# Patient Record
Sex: Female | Born: 1955 | Race: Black or African American | Hispanic: No | Marital: Married | Smoking: Never smoker
Health system: Southern US, Community
[De-identification: ages and names within clinical notes are randomized; demographics above are authoritative.]

## PROBLEM LIST (undated history)

## (undated) DIAGNOSIS — E119 Type 2 diabetes mellitus without complications: Secondary | ICD-10-CM

## (undated) DIAGNOSIS — E785 Hyperlipidemia, unspecified: Secondary | ICD-10-CM

## (undated) DIAGNOSIS — I10 Essential (primary) hypertension: Secondary | ICD-10-CM

## (undated) HISTORY — PX: APPENDECTOMY: SHX54

## (undated) HISTORY — PX: ABDOMINAL HYSTERECTOMY: SHX81

---

## 2008-09-01 ENCOUNTER — Emergency Department (HOSPITAL_BASED_OUTPATIENT_CLINIC_OR_DEPARTMENT_OTHER): Admission: EM | Admit: 2008-09-01 | Discharge: 2008-09-01 | Payer: Self-pay | Admitting: Emergency Medicine

## 2008-09-01 ENCOUNTER — Ambulatory Visit: Payer: Self-pay | Admitting: Diagnostic Radiology

## 2010-05-12 LAB — DIFFERENTIAL
Lymphocytes Relative: 35 % (ref 12–46)
Lymphs Abs: 3.3 10*3/uL (ref 0.7–4.0)
Monocytes Relative: 4 % (ref 3–12)
Neutro Abs: 5.6 10*3/uL (ref 1.7–7.7)
Neutrophils Relative %: 59 % (ref 43–77)

## 2010-05-12 LAB — BASIC METABOLIC PANEL
Calcium: 9.8 mg/dL (ref 8.4–10.5)
Chloride: 97 mEq/L (ref 96–112)
Creatinine, Ser: 0.8 mg/dL (ref 0.4–1.2)
GFR calc Af Amer: >60 mL/min (ref >60)
GFR calc non Af Amer: >60 mL/min (ref >60)

## 2010-05-12 LAB — POCT CARDIAC MARKERS
CKMB, poc: <1 ng/mL — ABNORMAL LOW (ref 1.0–8.0)
Myoglobin, poc: 51.9 ng/mL (ref 12–200)
Myoglobin, poc: 71.5 ng/mL (ref 12–200)

## 2010-05-12 LAB — CBC
RBC: 4.35 MIL/uL (ref 3.87–5.11)
WBC: 9.6 10*3/uL (ref 4.0–10.5)

## 2010-06-08 ENCOUNTER — Emergency Department (HOSPITAL_BASED_OUTPATIENT_CLINIC_OR_DEPARTMENT_OTHER)
Admission: EM | Admit: 2010-06-08 | Discharge: 2010-06-08 | Disposition: A | Discharge status: Home / Self Care | Payer: PRIVATE HEALTH INSURANCE | Attending: Emergency Medicine | Admitting: Emergency Medicine

## 2010-06-08 DIAGNOSIS — Z79899 Other long term (current) drug therapy: Secondary | ICD-10-CM | POA: Insufficient documentation

## 2010-06-08 DIAGNOSIS — E7800 Pure hypercholesterolemia, unspecified: Secondary | ICD-10-CM | POA: Insufficient documentation

## 2010-06-08 DIAGNOSIS — E1169 Type 2 diabetes mellitus with other specified complication: Secondary | ICD-10-CM | POA: Insufficient documentation

## 2010-06-08 DIAGNOSIS — I10 Essential (primary) hypertension: Secondary | ICD-10-CM | POA: Insufficient documentation

## 2010-06-08 LAB — DIFFERENTIAL
Lymphocytes Relative: 35 % (ref 12–46)
Lymphs Abs: 2.7 10*3/uL (ref 0.7–4.0)
Monocytes Absolute: 0.4 10*3/uL (ref 0.1–1.0)
Monocytes Relative: 5 % (ref 3–12)
Neutro Abs: 4.5 10*3/uL (ref 1.7–7.7)
Neutrophils Relative %: 59 % (ref 43–77)

## 2010-06-08 LAB — URINALYSIS, ROUTINE W REFLEX MICROSCOPIC
Bilirubin Urine: NEGATIVE
Glucose, UA: >1000 mg/dL — AB
Hgb urine dipstick: NEGATIVE
Ketones, ur: NEGATIVE mg/dL
Leukocytes, UA: NEGATIVE
Protein, ur: NEGATIVE mg/dL
pH: 6 (ref 5.0–8.0)

## 2010-06-08 LAB — CBC
HCT: 39.7 % (ref 36.0–46.0)
Hemoglobin: 13.6 g/dL (ref 12.0–15.0)
MCH: 29.6 pg (ref 26.0–34.0)
MCHC: 34.3 g/dL (ref 30.0–36.0)
MCV: 86.3 fL (ref 78.0–100.0)
RBC: 4.6 MIL/uL (ref 3.87–5.11)

## 2010-06-08 LAB — COMPREHENSIVE METABOLIC PANEL
ALT: 19 U/L (ref 0–35)
AST: 15 U/L (ref 0–37)
Albumin: 4 g/dL (ref 3.5–5.2)
Calcium: 10.7 mg/dL — ABNORMAL HIGH (ref 8.4–10.5)
Chloride: 90 mEq/L — ABNORMAL LOW (ref 96–112)
Creatinine, Ser: 0.8 mg/dL (ref 0.4–1.2)
GFR calc Af Amer: >60 mL/min (ref >60)
Sodium: 129 mEq/L — ABNORMAL LOW (ref 135–145)
Total Bilirubin: 0.3 mg/dL (ref 0.3–1.2)

## 2010-06-08 LAB — GLUCOSE, CAPILLARY
Glucose-Capillary: >600 mg/dL (ref 70–99)
Glucose-Capillary: 270 mg/dL — ABNORMAL HIGH (ref 70–99)

## 2010-06-08 LAB — URINE MICROSCOPIC-ADD ON

## 2010-06-09 LAB — GLUCOSE, CAPILLARY: Glucose-Capillary: 227 mg/dL — ABNORMAL HIGH (ref 70–99)

## 2016-03-26 ENCOUNTER — Emergency Department (HOSPITAL_BASED_OUTPATIENT_CLINIC_OR_DEPARTMENT_OTHER)
Admission: EM | Admit: 2016-03-26 | Discharge: 2016-03-26 | Disposition: A | Discharge status: Home / Self Care | Payer: Non-veteran care | Attending: Emergency Medicine | Admitting: Emergency Medicine

## 2016-03-26 ENCOUNTER — Encounter (HOSPITAL_BASED_OUTPATIENT_CLINIC_OR_DEPARTMENT_OTHER): Payer: Self-pay | Admitting: *Deleted

## 2016-03-26 ENCOUNTER — Emergency Department (HOSPITAL_BASED_OUTPATIENT_CLINIC_OR_DEPARTMENT_OTHER): Payer: Non-veteran care

## 2016-03-26 DIAGNOSIS — Z7984 Long term (current) use of oral hypoglycemic drugs: Secondary | ICD-10-CM | POA: Diagnosis not present

## 2016-03-26 DIAGNOSIS — I10 Essential (primary) hypertension: Secondary | ICD-10-CM | POA: Insufficient documentation

## 2016-03-26 DIAGNOSIS — Z79899 Other long term (current) drug therapy: Secondary | ICD-10-CM | POA: Diagnosis not present

## 2016-03-26 DIAGNOSIS — E119 Type 2 diabetes mellitus without complications: Secondary | ICD-10-CM | POA: Insufficient documentation

## 2016-03-26 DIAGNOSIS — R079 Chest pain, unspecified: Secondary | ICD-10-CM | POA: Diagnosis present

## 2016-03-26 HISTORY — DX: Type 2 diabetes mellitus without complications: E11.9

## 2016-03-26 HISTORY — DX: Hyperlipidemia, unspecified: E78.5

## 2016-03-26 HISTORY — DX: Essential (primary) hypertension: I10

## 2016-03-26 LAB — COMPREHENSIVE METABOLIC PANEL
ALT: 20 U/L (ref 14–54)
ANION GAP: 9 (ref 5–15)
AST: 21 U/L (ref 15–41)
Albumin: 3.7 g/dL (ref 3.5–5.0)
Alkaline Phosphatase: 61 U/L (ref 38–126)
BUN: 13 mg/dL (ref 6–20)
CHLORIDE: 101 mmol/L (ref 101–111)
CO2: 27 mmol/L (ref 22–32)
Calcium: 9.5 mg/dL (ref 8.9–10.3)
Creatinine, Ser: 0.85 mg/dL (ref 0.44–1.00)
GFR calc non Af Amer: >60 mL/min (ref >60)
Glucose, Bld: 115 mg/dL — ABNORMAL HIGH (ref 65–99)
POTASSIUM: 4 mmol/L (ref 3.5–5.1)
Sodium: 137 mmol/L (ref 135–145)
Total Bilirubin: 0.6 mg/dL (ref 0.3–1.2)
Total Protein: 7.5 g/dL (ref 6.5–8.1)

## 2016-03-26 LAB — TROPONIN I

## 2016-03-26 LAB — CBC
HCT: 39.7 % (ref 36.0–46.0)
Hemoglobin: 13.2 g/dL (ref 12.0–15.0)
MCH: 30.1 pg (ref 26.0–34.0)
MCHC: 33.2 g/dL (ref 30.0–36.0)
MCV: 90.6 fL (ref 78.0–100.0)
PLATELETS: 263 10*3/uL (ref 150–400)
RBC: 4.38 MIL/uL (ref 3.87–5.11)
RDW: 14 % (ref 11.5–15.5)
WBC: 9.6 10*3/uL (ref 4.0–10.5)

## 2016-03-26 MED ORDER — ASPIRIN 81 MG PO CHEW
324.0000 mg | CHEWABLE_TABLET | Freq: Once | ORAL | Status: AC
  Administered 2016-03-26: 324 mg via ORAL
  Filled 2016-03-26: qty 4

## 2016-03-26 MED ORDER — ONDANSETRON 4 MG PO TBDP: 4.0000 mg | ORAL_TABLET | Freq: Three times a day (TID) | ORAL | 0 refills | Status: AC | PRN

## 2016-03-26 NOTE — ED Provider Notes (Signed)
[facility] Provider Note   CSN: [id number] Arrival date & time: 03/26/16  1318     History   Chief Complaint Chief Complaint  Patient presents with  . Chest Pain    HPI Mikayla Ruiz is a 61 y.o. female with PMHx of DM controlled not on insulin, HTN, hyperlipidemia, GERD presents with left sided chest pain x 3 days. She describes her chest pain as  worsening, constant, throughout the day, dull, 7/10 pain. She reports radiation from underneath left breast, up to center of chest and then towards left shoulder. She reports associated radiation to her left arm and today she states that she felt some radiation to her left jaw. She reports associated sweats, chills, nausea, fatigue, several bouts of diarrhea that last 2 days. She denies any SOB to me, cough, fevers, recent travel, immobility, history of Ca, estrogen use, cold like symptoms, flu, changes in urinary symptoms or bowel movements. She reports trying aspirin yesterday morning, last night, and this morning with moderate relief. Patient also tried advil last night with no relief. Patient denies trying anything else. Patient denies anything like this in the past. She states that her pain was worse today after doing laundry. She denies any other association of chest pain to exertion, activity, deep inspiration, or intake of food. She states she takes all her medications as prescribed.  Per EMR patient had a left heart catheterization in 07/27/2014 showing as a normal result.   The history is provided by the patient. No language interpreter was used.  Chest Pain   Pertinent negatives include no abdominal pain, no cough, no fever, no nausea, no numbness, no shortness of breath and no vomiting.    Past Medical History:  Diagnosis Date  . Diabetes mellitus without complication (HCC)   . Hyperlipemia   . Hypertension     There are no active problems to display for this patient.   Past Surgical History:  Procedure Laterality  Date  . ABDOMINAL HYSTERECTOMY    . APPENDECTOMY      OB History    No data available       Home Medications    Prior to Admission medications   Medication Sig Start Date End Date Taking? Authorizing Provider  Dulaglutide (TRULICITY) 1.5 MG/0.5ML SOPN Inject into the skin.   Yes Historical Provider, MD  empagliflozin (JARDIANCE) 10 MG TABS tablet Take 10 mg by mouth daily.   Yes Historical Provider, MD  losartan (COZAAR) 50 MG tablet Take 50 mg by mouth daily.   Yes Historical Provider, MD  metFORMIN (GLUCOPHAGE) 1000 MG tablet Take 1,000 mg by mouth 2 (two) times daily with a meal.   Yes Historical Provider, MD  PARoxetine (PAXIL) 30 MG tablet Take 30 mg by mouth daily.   Yes Historical Provider, MD  simvastatin (ZOCOR) 10 MG tablet Take 10 mg by mouth daily.   Yes Historical Provider, MD  ondansetron (ZOFRAN ODT) 4 MG disintegrating tablet Take 1 tablet (4 mg total) by mouth every 8 (eight) hours as needed for nausea or vomiting. 03/26/16   Alvina Chou, [place]    Family History History reviewed. No pertinent family history.  Social History Social History  Substance Use Topics  . Smoking status: Never Smoker  . Smokeless tobacco: Not on file  . Alcohol use No     Allergies   Patient has no known allergies.   Review of Systems Review of Systems  Constitutional: Positive for chills. Negative for fever.  Respiratory:  Negative for cough and shortness of breath.   Cardiovascular: Positive for chest pain. Negative for leg swelling.  Gastrointestinal: Negative for abdominal pain, diarrhea, nausea and vomiting.  Genitourinary: Negative for difficulty urinating and dysuria.  Skin: Negative for rash and wound.  Neurological: Negative for numbness.  All other systems reviewed and are negative.    Physical Exam Updated Vital Signs BP 126/73   Pulse 82   Temp 98.6 F (37 C) (Oral)   Resp 14   Ht 5' (1.524 m)   Wt 77.1 kg   SpO2 97%   BMI 33.20 kg/m    Physical Exam  Constitutional: She is oriented to person, place, and time. She appears well-developed and well-nourished.  Well appearing  HENT:  Head: Normocephalic and atraumatic.  Nose: Nose normal.  Mouth/Throat: Oropharynx is clear and moist.  Eyes: Conjunctivae and EOM are normal.  Neck: Normal range of motion. Neck supple. No JVD present. No tracheal deviation present.  Cardiovascular: Normal rate, normal heart sounds and intact distal pulses.   Pulmonary/Chest: Effort normal and breath sounds normal. No stridor. No respiratory distress. She has no wheezes. She has no rales.  Normal work of breathing. No respiratory distress noted.   Abdominal: Soft. There is no tenderness. There is no rebound and no guarding.  Musculoskeletal: Normal range of motion.  Neurological: She is alert and oriented to person, place, and time.  Skin: Skin is warm.  No leg swelling or edema  Psychiatric: She has a normal mood and affect. Her behavior is normal.  Nursing note and vitals reviewed.    ED Treatments / Results  Labs (all labs ordered are listed, but only abnormal results are displayed) Labs Reviewed  COMPREHENSIVE METABOLIC PANEL - Abnormal; Notable for the following:       Result Value   Glucose, Bld 115 (*)    All other components within normal limits  CBC  TROPONIN I  TROPONIN I    EKG  EKG Interpretation  Date/Time:  Wednesday March 26 2016 13:30:48 EST Ventricular Rate:  92 PR Interval:    QRS Duration: 78 QT Interval:  353 QTC Calculation: 437 R Axis:   74 Text Interpretation:  Sinus rhythm No previous ECGs available Confirmed by LITTLE MD, RACHEL [phone] on 03/26/2016 5:58:11 PM       Radiology Dg Chest 2 View  Result Date: 03/26/2016 CLINICAL DATA:  Chest pain and nausea for 2 days EXAM: CHEST  2 VIEW COMPARISON:  07/24/2014 FINDINGS: Normal mediastinum and cardiac silhouette. Normal pulmonary vasculature. No evidence of effusion, infiltrate, or  pneumothorax. No acute bony abnormality. IMPRESSION: No acute cardiopulmonary process. Electronically Signed   By: Genevive BiStewart  Edmunds M.D.   On: 03/26/2016 14:28    Procedures Procedures (including critical care time)  Medications Ordered in ED Medications  aspirin chewable tablet 324 mg (324 mg Oral Given 03/26/16 1540)     Initial Impression / Assessment and Plan / ED Course  I have reviewed the triage vital signs and the nursing notes.  Pertinent labs & imaging results that were available during my care of the patient were reviewed by me and considered in my medical decision making (see chart for details).    Patient has a left-sided chest pain 3 days. Risk Factors include HTN, HLD, DM, Given aspirin in ED. Chest pain is not likely of cardiac or pulmonary etiology d/t presentation. She is hemodynamically stable, no tracheal deviation, no JVD or new murmur, RRR, breath sounds equal bilaterally, EKG without acute  abnormalities, negative troponin x2, and negative CXR. Heart Score 3-4.  Due to patient's recent normal left heart catheterization, recent history of diarrhea, especially sometimes after she eats, I feel it is less likely it is cardiac related and feel safe to discharge patient.  Pt has been advised to return to the ED if CP becomes exertional, associated with diaphoresis or nausea, radiates to left jaw/arm, worsens or becomes concerning in any way. Pt appears reliable for follow up and is agreeable to discharge. Patient is in no acute distress. Hemodynamic stable. Patient able to tolerate PO. Patient also seen and evaluated by Dr. Clarene Duke who agrees with assessment and plan.  Final Clinical Impressions(s) / ED Diagnoses   Final diagnoses:  Nonspecific chest pain    New Prescriptions New Prescriptions   ONDANSETRON (ZOFRAN ODT) 4 MG DISINTEGRATING TABLET    Take 1 tablet (4 mg total) by mouth every 8 (eight) hours as needed for nausea or vomiting.     [address] [place],  [place] 03/26/16 1838    Laurence Spates, MD 03/29/16 [phone]

## 2016-03-26 NOTE — Discharge Instructions (Signed)
Please follow up with her primary care provider in 2-5 days regarding today's visit. Please take Zofran as needed for nausea or vomiting.  Get help right away if: Your chest pain is worse. You have a cough that gets worse, or you cough up blood. You have severe pain in your abdomen. You have severe weakness. You faint. You have sudden, unexplained chest discomfort. You have sudden, unexplained discomfort in your arms, back, neck, or jaw. You have shortness of breath at any time. You suddenly start to sweat, or your skin gets clammy. You feel nauseous or you vomit. You suddenly feel light-headed or dizzy. Your heart begins to beat quickly, or it feels like it is skipping beats.

## 2016-03-26 NOTE — ED Triage Notes (Signed)
Pt c/o left sided chest pain which radiates up left jaw  x 2 days with SOB x 2 days

## 2017-04-07 IMAGING — CR DG CHEST 2V
2 series · 2 of 2 positions shown · non-contrast
Comparison: 07/24/2014

CLINICAL DATA: Chest pain and nausea for 2 days

EXAM:
CHEST  2 VIEW

[w chest pa]
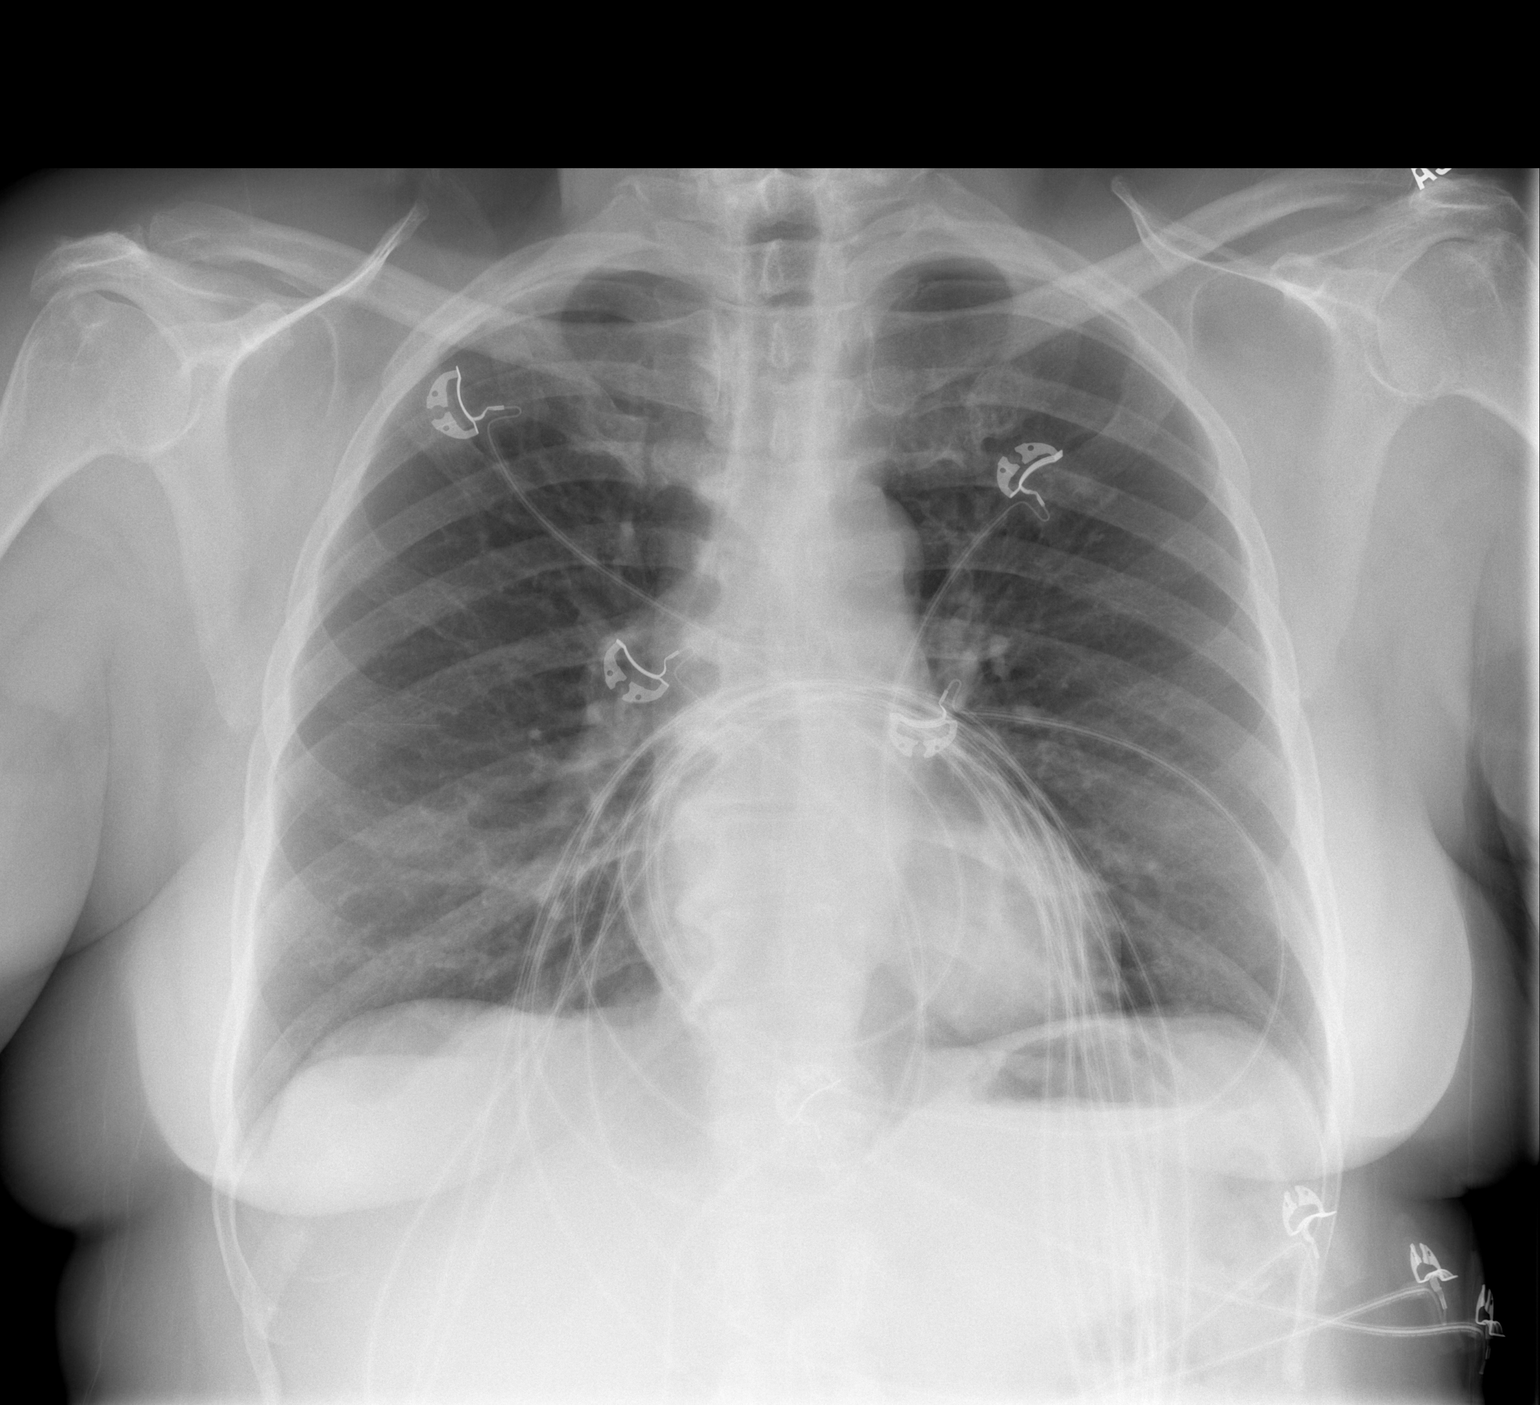

[w chest lat]
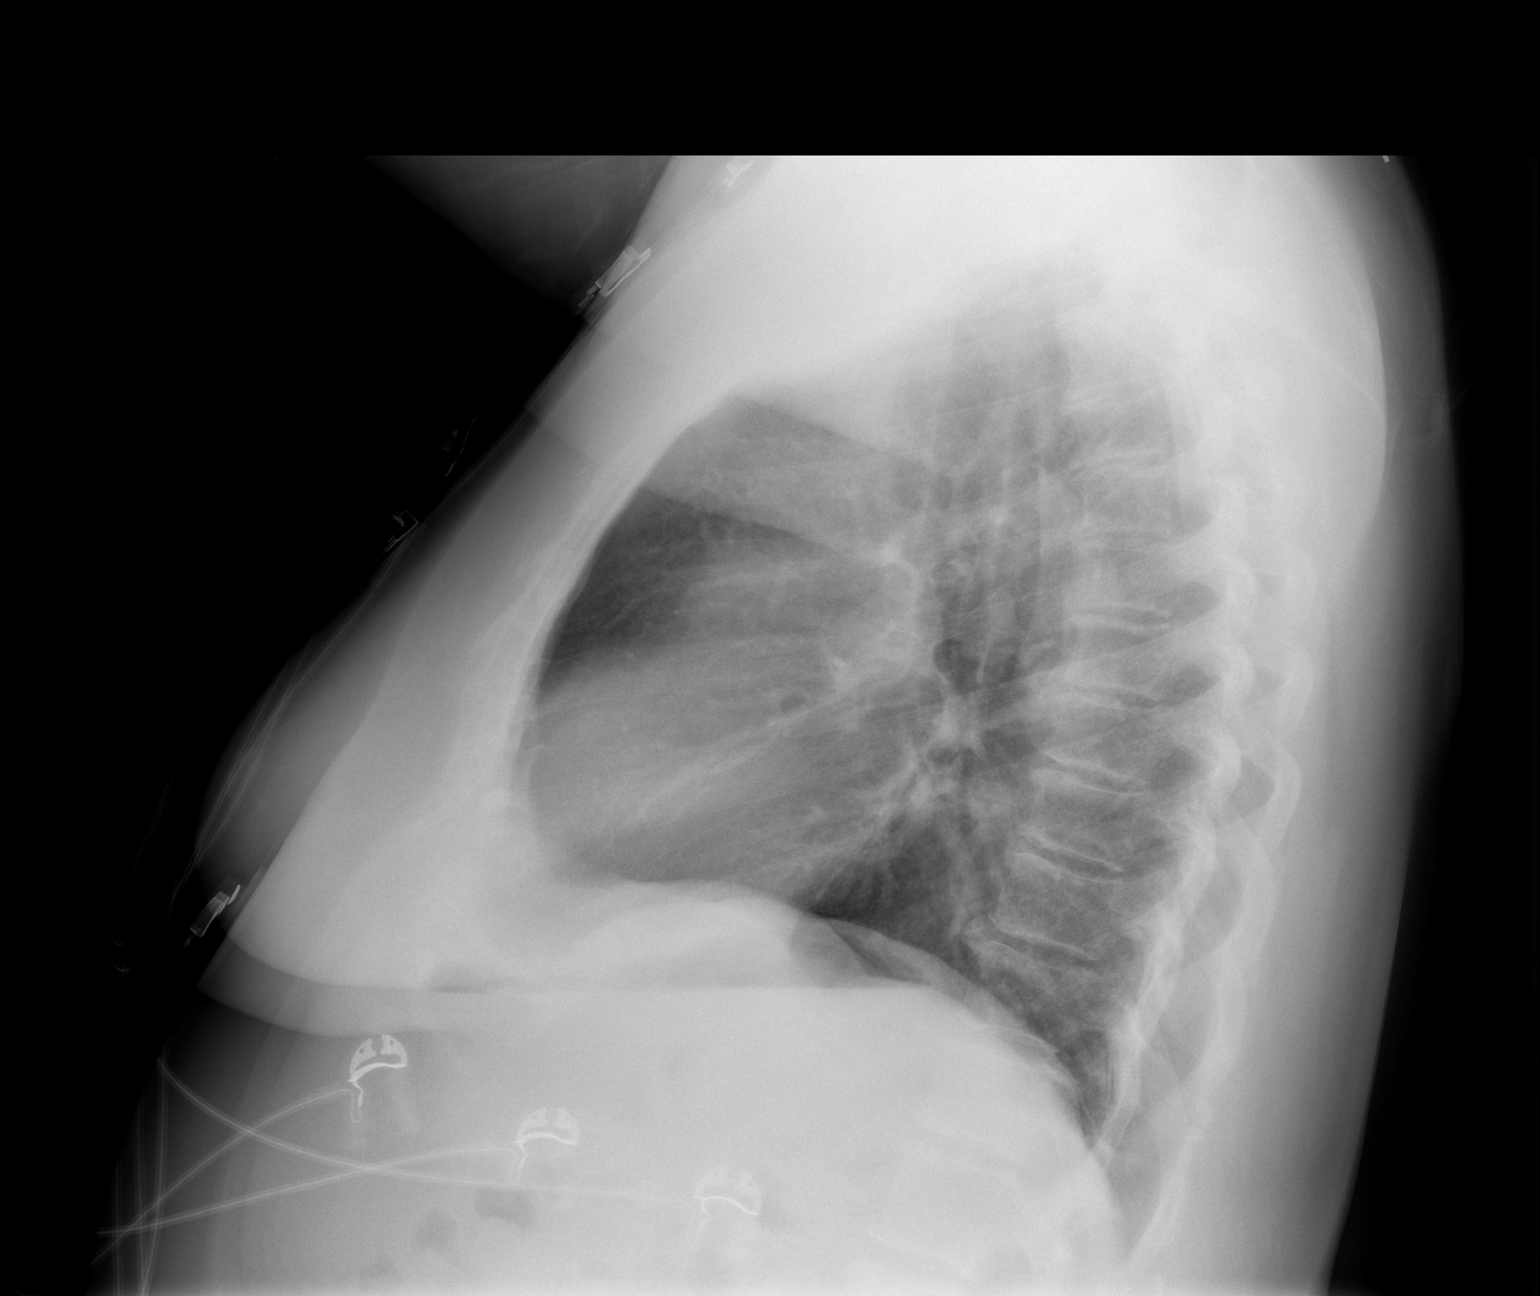

[2 of 2 positions shown; findings below may reference images not displayed]

FINDINGS: Normal mediastinum and cardiac silhouette. Normal pulmonary
vasculature. No evidence of effusion, infiltrate, or pneumothorax.
No acute bony abnormality.
IMPRESSION: No acute cardiopulmonary process.

## 2019-09-03 ENCOUNTER — Emergency Department (HOSPITAL_BASED_OUTPATIENT_CLINIC_OR_DEPARTMENT_OTHER)
Admission: EM | Admit: 2019-09-03 | Discharge: 2019-09-03 | Disposition: A | Discharge status: Home / Self Care | Attending: Emergency Medicine | Admitting: Emergency Medicine

## 2019-09-03 ENCOUNTER — Other Ambulatory Visit: Payer: Self-pay

## 2019-09-03 ENCOUNTER — Encounter (HOSPITAL_BASED_OUTPATIENT_CLINIC_OR_DEPARTMENT_OTHER): Payer: Self-pay

## 2019-09-03 DIAGNOSIS — Z7984 Long term (current) use of oral hypoglycemic drugs: Secondary | ICD-10-CM | POA: Insufficient documentation

## 2019-09-03 DIAGNOSIS — Z79899 Other long term (current) drug therapy: Secondary | ICD-10-CM | POA: Diagnosis not present

## 2019-09-03 DIAGNOSIS — E119 Type 2 diabetes mellitus without complications: Secondary | ICD-10-CM | POA: Diagnosis not present

## 2019-09-03 DIAGNOSIS — I10 Essential (primary) hypertension: Secondary | ICD-10-CM | POA: Diagnosis not present

## 2019-09-03 DIAGNOSIS — R55 Syncope and collapse: Secondary | ICD-10-CM | POA: Diagnosis not present

## 2019-09-03 LAB — URINALYSIS, ROUTINE W REFLEX MICROSCOPIC
Bilirubin Urine: NEGATIVE
Glucose, UA: >=500 mg/dL — AB
Hgb urine dipstick: NEGATIVE
Ketones, ur: NEGATIVE mg/dL
Leukocytes,Ua: NEGATIVE
Nitrite: NEGATIVE
Protein, ur: NEGATIVE mg/dL
Specific Gravity, Urine: 1.02 (ref 1.005–1.030)
pH: 7 (ref 5.0–8.0)

## 2019-09-03 LAB — URINALYSIS, MICROSCOPIC (REFLEX): RBC / HPF: NONE SEEN RBC/hpf (ref 0–5)

## 2019-09-03 LAB — CBC
HCT: 41.2 % (ref 36.0–46.0)
Hemoglobin: 12.9 g/dL (ref 12.0–15.0)
MCH: 28.9 pg (ref 26.0–34.0)
MCHC: 31.3 g/dL (ref 30.0–36.0)
MCV: 92.4 fL (ref 80.0–100.0)
Platelets: 326 10*3/uL (ref 150–400)
RBC: 4.46 MIL/uL (ref 3.87–5.11)
RDW: 15.1 % (ref 11.5–15.5)
WBC: 9.3 10*3/uL (ref 4.0–10.5)
nRBC: 0 % (ref 0.0–0.2)

## 2019-09-03 LAB — BASIC METABOLIC PANEL
Anion gap: 13 (ref 5–15)
BUN: 14 mg/dL (ref 8–23)
CO2: 27 mmol/L (ref 22–32)
Calcium: 9.9 mg/dL (ref 8.9–10.3)
Chloride: 97 mmol/L — ABNORMAL LOW (ref 98–111)
Creatinine, Ser: 0.91 mg/dL (ref 0.44–1.00)
GFR calc Af Amer: >60 mL/min (ref >60)
GFR calc non Af Amer: >60 mL/min (ref >60)
Glucose, Bld: 145 mg/dL — ABNORMAL HIGH (ref 70–99)
Potassium: 4.1 mmol/L (ref 3.5–5.1)
Sodium: 137 mmol/L (ref 135–145)

## 2019-09-03 LAB — CBG MONITORING, ED: Glucose-Capillary: 196 mg/dL — ABNORMAL HIGH (ref 70–99)

## 2019-09-03 NOTE — ED Notes (Signed)
Pt endorses feeling tired, and a near syncope while out shopping

## 2019-09-03 NOTE — ED Notes (Signed)
Pt ambulatory with steady gait, stand by assist to restroom for urine specimen 

## 2019-09-03 NOTE — ED Notes (Signed)
Pharmacy and medications updated with patient 

## 2019-09-03 NOTE — ED Triage Notes (Addendum)
Pt started having nausea and lightheadedness today. Pt was with family and they noticed that she started to become unsteady and assisted her into a chair. Pt states she felt like she was about to passe out. Denies any pain. At this time pt just feels sleepy.

## 2019-09-03 NOTE — ED Notes (Signed)
ED Provider at bedside. 

## 2019-09-03 NOTE — ED Provider Notes (Signed)
[facility] EMERGENCY DEPARTMENT Provider Note   CSN: [id number] Arrival date & time: 09/03/19  1554     History Chief Complaint  Patient presents with  . Near Syncope    Mikayla Ruiz is a 64 y.o. female.  64 year old female with past medical history including hypertension, hyperlipidemia, and type 2 diabetes mellitus who presents with near syncope.  Earlier today, patient was with family out shopping when she began feeling lightheaded/dizzy and nauseated.  They later went home and while she was in the kitchen she began feeling very unsteady like she was going to pass out.  They were able to assist her to a chair and she did not actually pass out.  No fall.  She denies any associated chest pain or shortness of breath.  Currently, she feels fatigued like she wants to go to sleep but denies any other complaints.  She denies any preceding illness, new medications.  States she has been eating and drinking normally.  The history is provided by the patient.  Near Syncope       Past Medical History:  Diagnosis Date  . Diabetes mellitus without complication (HCC)   . Hyperlipemia   . Hypertension     There are no problems to display for this patient.   Past Surgical History:  Procedure Laterality Date  . ABDOMINAL HYSTERECTOMY    . APPENDECTOMY       OB History   No obstetric history on file.     No family history on file.  Social History   Tobacco Use  . Smoking status: Never Smoker  . Smokeless tobacco: Never Used  Vaping Use  . Vaping Use: Never used  Substance Use Topics  . Alcohol use: Yes    Comment: occ  . Drug use: No    Home Medications Prior to Admission medications   Medication Sig Start Date End Date Taking? Authorizing Provider  Dulaglutide (TRULICITY) 1.5 MG/0.5ML SOPN Inject into the skin.    [provider]  empagliflozin (JARDIANCE) 10 MG TABS tablet Take 10 mg by mouth daily.    [provider]  losartan (COZAAR)  50 MG tablet Take 50 mg by mouth daily.    [provider]  metFORMIN (GLUCOPHAGE) 1000 MG tablet Take 1,000 mg by mouth 2 (two) times daily with a meal.    [provider]  ondansetron (ZOFRAN ODT) 4 MG disintegrating tablet Take 1 tablet (4 mg total) by mouth every 8 (eight) hours as needed for nausea or vomiting. 03/26/16   Alvina Chou, PA  PARoxetine (PAXIL) 30 MG tablet Take 30 mg by mouth daily.    [provider]  simvastatin (ZOCOR) 10 MG tablet Take 10 mg by mouth daily.    [provider]    Allergies    Patient has no known allergies.  Review of Systems   Review of Systems  Cardiovascular: Positive for near-syncope.   All other systems reviewed and are negative except that which was mentioned in HPI  Physical Exam Updated Vital Signs BP 119/75 (BP Location: Right Arm)   Pulse 67   Temp 97.6 F (36.4 C) (Oral)   Resp 16   Ht 5\' 1"  (1.549 m)   Wt 77.6 kg   SpO2 98%   BMI 32.31 kg/m   Physical Exam Vitals and nursing note reviewed.  Constitutional:      General: She is not in acute distress.    Appearance: She is well-developed.  HENT:  Head: Normocephalic and atraumatic.  Eyes:     Conjunctiva/sclera: Conjunctivae normal.  Cardiovascular:     Rate and Rhythm: Normal rate and regular rhythm.     Heart sounds: Normal heart sounds. No murmur heard.   Pulmonary:     Effort: Pulmonary effort is normal.     Breath sounds: Normal breath sounds.  Abdominal:     General: Bowel sounds are normal. There is no distension.     Palpations: Abdomen is soft.     Tenderness: There is no abdominal tenderness.  Musculoskeletal:     Cervical back: Neck supple.     Right lower leg: No edema.     Left lower leg: No edema.  Skin:    General: Skin is warm and dry.  Neurological:     Mental Status: She is alert and oriented to person, place, and time.     Comments: Fluent speech  Psychiatric:        Mood and Affect: Mood  normal.        Judgment: Judgment normal.     ED Results / Procedures / Treatments   Labs (all labs ordered are listed, but only abnormal results are displayed) Labs Reviewed  BASIC METABOLIC PANEL - Abnormal; Notable for the following components:      Result Value   Chloride 97 (*)    Glucose, Bld 145 (*)    All other components within normal limits  URINALYSIS, ROUTINE W REFLEX MICROSCOPIC - Abnormal; Notable for the following components:   Glucose, UA >=500 (*)    All other components within normal limits  URINALYSIS, MICROSCOPIC (REFLEX) - Abnormal; Notable for the following components:   Bacteria, UA FEW (*)    All other components within normal limits  CBG MONITORING, ED - Abnormal; Notable for the following components:   Glucose-Capillary 196 (*)    All other components within normal limits  CBC    EKG EKG Interpretation  Date/Time:  Saturday September 03 2019 16:22:30 EDT Ventricular Rate:  87 PR Interval:  148 QRS Duration: 74 QT Interval:  382 QTC Calculation: 459 R Axis:   67 Text Interpretation: Normal sinus rhythm Normal ECG No significant change since last tracing Confirmed by Frederick Peers [phone]) on 09/03/2019 9:43:23 PM   Radiology No results found.  Procedures Procedures (including critical care time)  Medications Ordered in ED Medications - No data to display  ED Course  I have reviewed the triage vital signs and the nursing notes.  Pertinent labs that were available during my care of the patient were reviewed by me and considered in my medical decision making (see chart for details).    MDM Rules/Calculators/A&P                          Well-appearing on exam, normal vital signs, reassuring physical exam.  EKG shows sinus rhythm, normal.  Screening lab work shows normal creatinine, no electrolyte derangement, normal CBC with no evidence of anemia.  Given that patient is currently asymptomatic, I do not feel she needs any further work-up at this  time but have encouraged her to follow-up with PCP as she may need further evaluation if she has recurrence of the symptoms.  Have discussed supportive measures including staying hydrated, not skipping meals. Reviewed return precautions. Final Clinical Impression(s) / ED Diagnoses Final diagnoses:  Near syncope    Rx / DC Orders ED Discharge Orders    None  Shaunte Tuft, Ambrose [place], MD 09/03/19 [phone]

## 2022-06-13 ENCOUNTER — Encounter (HOSPITAL_BASED_OUTPATIENT_CLINIC_OR_DEPARTMENT_OTHER): Payer: Self-pay

## 2022-06-13 ENCOUNTER — Emergency Department (HOSPITAL_BASED_OUTPATIENT_CLINIC_OR_DEPARTMENT_OTHER): Payer: No Typology Code available for payment source

## 2022-06-13 ENCOUNTER — Emergency Department (HOSPITAL_BASED_OUTPATIENT_CLINIC_OR_DEPARTMENT_OTHER)
Admission: EM | Admit: 2022-06-13 | Discharge: 2022-06-13 | Disposition: A | Discharge status: Home / Self Care | Payer: No Typology Code available for payment source | Attending: Emergency Medicine | Admitting: Emergency Medicine

## 2022-06-13 ENCOUNTER — Other Ambulatory Visit: Payer: Self-pay

## 2022-06-13 DIAGNOSIS — Y92003 Bedroom of unspecified non-institutional (private) residence as the place of occurrence of the external cause: Secondary | ICD-10-CM | POA: Diagnosis not present

## 2022-06-13 DIAGNOSIS — S0990XA Unspecified injury of head, initial encounter: Secondary | ICD-10-CM

## 2022-06-13 DIAGNOSIS — S0083XA Contusion of other part of head, initial encounter: Secondary | ICD-10-CM | POA: Diagnosis not present

## 2022-06-13 DIAGNOSIS — W01198A Fall on same level from slipping, tripping and stumbling with subsequent striking against other object, initial encounter: Secondary | ICD-10-CM | POA: Insufficient documentation

## 2022-06-13 MED ORDER — ACETAMINOPHEN 500 MG PO TABS
1000.0000 mg | ORAL_TABLET | Freq: Once | ORAL | Status: AC
  Administered 2022-06-13: 1000 mg via ORAL
  Filled 2022-06-13: qty 2

## 2022-06-13 NOTE — ED Provider Notes (Signed)
[place] [facility] AT [facility]  Provider Note  CSN: [id number] Arrival date & time: 06/13/22 0257  History Chief Complaint  Patient presents with   Head Injury    Mikayla Ruiz is a 67 y.o. female reports she tripped over a suitcase in her bedroom coming back from the bathroom about 2 hours ago, hitting her head on something on the way down. She thinks she may have had a brief LOC. She reports she had a hematoma on her R forehead which has improved with ice at home but has continued to have a persistent headache prompting her ED visit. Denies any nausea, vomiting, weakness or difficulty walking.    Home Medications Prior to Admission medications   Medication Sig Start Date End Date Taking? Authorizing Provider  Dulaglutide (TRULICITY) 1.5 MG/0.5ML SOPN Inject into the skin.    [provider]  empagliflozin (JARDIANCE) 10 MG TABS tablet Take 10 mg by mouth daily.    [provider]  losartan (COZAAR) 50 MG tablet Take 50 mg by mouth daily.    [provider]  metFORMIN (GLUCOPHAGE) 1000 MG tablet Take 1,000 mg by mouth 2 (two) times daily with a meal.    [provider]  ondansetron (ZOFRAN ODT) 4 MG disintegrating tablet Take 1 tablet (4 mg total) by mouth every 8 (eight) hours as needed for nausea or vomiting. 03/26/16   Alvina Chou, PA  PARoxetine (PAXIL) 30 MG tablet Take 30 mg by mouth daily.    [provider]  simvastatin (ZOCOR) 10 MG tablet Take 10 mg by mouth daily.    [provider]     Allergies    Patient has no known allergies.   Review of Systems   Review of Systems Please see HPI for pertinent positives and negatives  Physical Exam BP (!) 143/75 (BP Location: Right Arm)   Pulse 84   Temp 98.7 F (37.1 C) (Oral)   Resp 18   Ht 5\' 1"  (1.549 m)   Wt 77.1 kg   SpO2 98%   BMI 32.12 kg/m   Physical Exam Vitals and nursing note reviewed.  Constitutional:       Appearance: Normal appearance.  HENT:     Head: Normocephalic.     Comments: Hematoma and tenderness R forehead    Nose: Nose normal.     Mouth/Throat:     Mouth: Mucous membranes are moist.  Eyes:     Extraocular Movements: Extraocular movements intact.     Conjunctiva/sclera: Conjunctivae normal.  Cardiovascular:     Rate and Rhythm: Normal rate.  Pulmonary:     Effort: Pulmonary effort is normal.     Breath sounds: Normal breath sounds.  Abdominal:     General: Abdomen is flat.     Palpations: Abdomen is soft.     Tenderness: There is no abdominal tenderness.  Musculoskeletal:        General: No swelling. Normal range of motion.     Cervical back: Neck supple. No tenderness.  Skin:    General: Skin is warm and dry.  Neurological:     General: No focal deficit present.     Mental Status: She is alert.  Psychiatric:        Mood and Affect: Mood normal.     ED Results / Procedures / Treatments   EKG None  Procedures Procedures  Medications Ordered in the ED Medications  acetaminophen (TYLENOL) tablet 1,000 mg (1,000 mg Oral Given  06/13/22 0317)    Initial Impression and Plan  Patient here with persistent headache after head injury a few hours ago. Will send for CT. APAP for pain.   ED Course   Clinical Course as of 06/13/22 0357  Fri Jun 13, 2022  1610 I personally viewed the images from radiology studies and agree with radiologist interpretation: CT is neg for acute process. Patient given head injury precautions. PCP follow up, RTED for any other concerns.   [CS]    Clinical Course User Index [CS] Pollyann Savoy, MD     MDM Rules/Calculators/A&P Medical Decision Making Problems Addressed: Minor head injury, initial encounter: acute illness or injury  Amount and/or Complexity of Data Reviewed Radiology: ordered and independent interpretation performed. Decision-making details documented in ED Course.  Risk OTC drugs.     Final Clinical  Impression(s) / ED Diagnoses Final diagnoses:  Minor head injury, initial encounter    Rx / DC Orders ED Discharge Orders     None        Pollyann Savoy, MD 06/13/22 [phone]

## 2022-06-13 NOTE — ED Triage Notes (Signed)
Pt tripped and fell over a suitcase in her bedroom. Hit her head on the door or a dresser. Did have a hematoma above the right eye, now complaining of a headache that will not let up.
